# Patient Record
Sex: Male | Born: 1975 | Hispanic: Yes | Marital: Married | Smoking: Current every day smoker
Health system: Southern US, Community
[De-identification: ages and names within clinical notes are randomized; demographics above are authoritative.]

---

## 2011-11-14 ENCOUNTER — Emergency Department (HOSPITAL_COMMUNITY)
Admission: EM | Admit: 2011-11-14 | Discharge: 2011-11-14 | Disposition: A | Discharge status: Home / Self Care | Payer: BC Managed Care – PPO | Attending: Emergency Medicine | Admitting: Emergency Medicine

## 2011-11-14 ENCOUNTER — Emergency Department (HOSPITAL_COMMUNITY): Payer: BC Managed Care – PPO

## 2011-11-14 ENCOUNTER — Encounter (HOSPITAL_COMMUNITY): Payer: Self-pay | Admitting: Emergency Medicine

## 2011-11-14 DIAGNOSIS — N200 Calculus of kidney: Secondary | ICD-10-CM | POA: Insufficient documentation

## 2011-11-14 DIAGNOSIS — F17200 Nicotine dependence, unspecified, uncomplicated: Secondary | ICD-10-CM | POA: Insufficient documentation

## 2011-11-14 DIAGNOSIS — N1330 Unspecified hydronephrosis: Secondary | ICD-10-CM | POA: Insufficient documentation

## 2011-11-14 LAB — URINALYSIS, ROUTINE W REFLEX MICROSCOPIC
Ketones, ur: 15 mg/dL — AB
Leukocytes, UA: NEGATIVE
Nitrite: NEGATIVE
Specific Gravity, Urine: 1.031 — ABNORMAL HIGH (ref 1.005–1.030)
pH: 6 (ref 5.0–8.0)

## 2011-11-14 LAB — BASIC METABOLIC PANEL
BUN: 16 mg/dL (ref 6–23)
Chloride: 102 mEq/L (ref 96–112)
GFR calc Af Amer: >90 mL/min (ref >90)
Potassium: 3.3 mEq/L — ABNORMAL LOW (ref 3.5–5.1)

## 2011-11-14 LAB — CBC WITH DIFFERENTIAL/PLATELET
Basophils Absolute: 0 10*3/uL (ref 0.0–0.1)
Basophils Relative: 0 % (ref 0–1)
Hemoglobin: 15.1 g/dL (ref 13.0–17.0)
MCHC: 34.4 g/dL (ref 30.0–36.0)
Monocytes Relative: 5 % (ref 3–12)
Neutro Abs: 9 10*3/uL — ABNORMAL HIGH (ref 1.7–7.7)
Neutrophils Relative %: 81 % — ABNORMAL HIGH (ref 43–77)
RBC: 4.64 MIL/uL (ref 4.22–5.81)
WBC: 11.2 10*3/uL — ABNORMAL HIGH (ref 4.0–10.5)

## 2011-11-14 LAB — URINE MICROSCOPIC-ADD ON

## 2011-11-14 MED ORDER — HYDROMORPHONE HCL PF 1 MG/ML IJ SOLN
1.0000 mg | Freq: Once | INTRAMUSCULAR | Status: AC
  Administered 2011-11-14: 1 mg via INTRAVENOUS
  Filled 2011-11-14: qty 1

## 2011-11-14 MED ORDER — ONDANSETRON HCL 4 MG/2ML IJ SOLN
4.0000 mg | Freq: Once | INTRAMUSCULAR | Status: AC
  Administered 2011-11-14: 4 mg via INTRAVENOUS
  Filled 2011-11-14: qty 2

## 2011-11-14 MED ORDER — TAMSULOSIN HCL 0.4 MG PO CAPS: 0.4000 mg | ORAL_CAPSULE | Freq: Every day | ORAL | Status: AC

## 2011-11-14 MED ORDER — ONDANSETRON HCL 4 MG PO TABS: 4.0000 mg | ORAL_TABLET | Freq: Four times a day (QID) | ORAL | Status: AC

## 2011-11-14 MED ORDER — MORPHINE SULFATE 4 MG/ML IJ SOLN
6.0000 mg | Freq: Once | INTRAMUSCULAR | Status: AC
  Administered 2011-11-14: 6 mg via INTRAVENOUS
  Filled 2011-11-14 (×2): qty 1

## 2011-11-14 MED ORDER — OXYCODONE-ACETAMINOPHEN 5-325 MG PO TABS: 2.0000 | ORAL_TABLET | ORAL | Status: AC | PRN

## 2011-11-14 NOTE — ED Notes (Signed)
Patient with abdominal pain, nausea and vomiting since Friday.  Patient states his pain goes from abdomen into groin and radiates into testicles on the right.

## 2011-11-14 NOTE — ED Notes (Signed)
Pt states understanding of discharge instructions 

## 2011-11-14 NOTE — ED Provider Notes (Signed)
History     CSN: [id number]  Arrival date & time 11/14/11  0028   First MD Initiated Contact with Patient 11/14/11 0115      Chief Complaint  Patient presents with  . Abdominal Pain    (Consider location/radiation/quality/duration/timing/severity/associated sxs/prior treatment) HPI 36 year old male presents to emergency room from home with complaint of right-sided abdominal pain. Patient reports pain is in his right lower abdomen radiating into his groin and testicle. Patient also complaining of right flank pain. Pain started on Friday. He has had nausea and vomiting since that time. He denies any fever, no sick contacts, no similar symptoms. Patient reports he is unable to get comfortable due to the pain. Patient's girlfriend reports that he is constantly trying to find a comfortable position and appears that he is in labor.  History reviewed. No pertinent past medical history.  History reviewed. No pertinent past surgical history.  History reviewed. No pertinent family history.  History  Substance Use Topics  . Smoking status: Current Everyday Smoker  . Smokeless tobacco: Not on file  . Alcohol Use: No      Review of Systems  All other systems reviewed and are negative.   other than listed in history of present illness  Allergies  Review of patient's allergies indicates no known allergies.  Home Medications   Current Outpatient Rx  Name Route Sig Dispense Refill  . ACETAMINOPHEN 500 MG PO TABS Oral Take 1,000 mg by mouth every 6 (six) hours as needed. For pain      BP 123/79  Temp 98.7 F (37.1 C) (Oral)  Resp 24  SpO2 98%  Physical Exam  Nursing note and vitals reviewed. Constitutional: He appears well-developed and well-nourished. He appears distressed (in significant pain).  HENT:  Head: Normocephalic and atraumatic.  Nose: Nose normal.  Mouth/Throat: Oropharynx is clear and moist. No oropharyngeal exudate.  Neck: Normal range of motion. Neck supple.  No JVD present. No tracheal deviation present. No thyromegaly present.  Cardiovascular: Normal rate, regular rhythm, normal heart sounds and intact distal pulses.  Exam reveals no gallop and no friction rub.   No murmur heard. Pulmonary/Chest: Effort normal and breath sounds normal. No stridor. No respiratory distress. He has no wheezes. He has no rales. He exhibits no tenderness.  Abdominal: Soft. Bowel sounds are normal. He exhibits no distension and no mass. There is no tenderness. There is no rebound and no guarding.  Musculoskeletal: Normal range of motion. He exhibits no edema and no tenderness.  Lymphadenopathy:    He has no cervical adenopathy.  Skin: Skin is warm and dry. No rash noted. No erythema. No pallor.  Psychiatric: He has a normal mood and affect. His behavior is normal. Judgment and thought content normal.    ED Course  Procedures (including critical care time)  Labs Reviewed  URINALYSIS, ROUTINE W REFLEX MICROSCOPIC - Abnormal; Notable for the following:    Color, Urine AMBER (*)  BIOCHEMICALS MAY BE AFFECTED BY COLOR   APPearance TURBID (*)     Specific Gravity, Urine 1.031 (*)     Hgb urine dipstick LARGE (*)     Ketones, ur 15 (*)     Protein, ur 100 (*)     All other components within normal limits  CBC WITH DIFFERENTIAL - Abnormal; Notable for the following:    WBC 11.2 (*)     Neutrophils Relative 81 (*)     Neutro Abs 9.0 (*)     All other components within  normal limits  BASIC METABOLIC PANEL - Abnormal; Notable for the following:    Potassium 3.3 (*)     Glucose, Bld 132 (*)     All other components within normal limits  URINE MICROSCOPIC-ADD ON - Abnormal; Notable for the following:    Squamous Epithelial / LPF FEW (*)     Bacteria, UA FEW (*)     Crystals CA OXALATE CRYSTALS (*)     All other components within normal limits   Ct Abdomen Pelvis Wo Contrast  11/14/2011  *RADIOLOGY REPORT*  Clinical Data: Right-sided abdominal pain.  CT ABDOMEN AND  PELVIS WITHOUT CONTRAST  Technique:  Multidetector CT imaging of the abdomen and pelvis was performed following the standard protocol without intravenous contrast.  Comparison: None.  Findings: Limited images through the lung bases demonstrate no significant appreciable abnormality. The heart size is within normal limits. No pleural or pericardial effusion.  Organ abnormality/lesion detection is limited in the absence of intravenous contrast. Within this limitation, unremarkable liver, biliary system, spleen, pancreas, adrenal glands, left kidney. There is right renal edema and mild perinephric fat stranding. Nonobstructing interpolar stone.  Mild hydroureteronephrosis to the level of a 4 mm right UVJ stone.  No bowel obstruction.  No CT evidence for colitis.  Normal appendix.  No free intraperitoneal air or fluid.  Normal caliber vasculature.  No lymphadenopathy.  Circumferential bladder wall thickening is nonspecific given incomplete distension.  Nonspecific prostatic calcifications.  No acute osseous finding.  IMPRESSION: Mild right hydroureteronephrosis to the level of a 4 mm right UVJ stone.  Additional nonobstructing tiny interpolar stone on the right.  Original Report Authenticated By: Waneta Martins, M.D.     1. Kidney stone on right side       MDM  36 year old male who appears to have renal colic. Patient with blood noted in his urine. Will get CT abdomen pelvis without contrast to evaluate for size and location of stone        Olivia Mackie, MD 11/14/11 [phone]

## 2018-08-03 ENCOUNTER — Encounter (HOSPITAL_COMMUNITY): Payer: Self-pay

## 2018-08-03 ENCOUNTER — Emergency Department (HOSPITAL_COMMUNITY)
Admission: EM | Admit: 2018-08-03 | Discharge: 2018-08-03 | Disposition: A | Discharge status: Home / Self Care | Payer: Self-pay | Attending: Emergency Medicine | Admitting: Emergency Medicine

## 2018-08-03 ENCOUNTER — Emergency Department (HOSPITAL_COMMUNITY): Payer: Self-pay

## 2018-08-03 ENCOUNTER — Other Ambulatory Visit: Payer: Self-pay

## 2018-08-03 DIAGNOSIS — N200 Calculus of kidney: Secondary | ICD-10-CM | POA: Insufficient documentation

## 2018-08-03 DIAGNOSIS — F17200 Nicotine dependence, unspecified, uncomplicated: Secondary | ICD-10-CM | POA: Insufficient documentation

## 2018-08-03 DIAGNOSIS — N201 Calculus of ureter: Secondary | ICD-10-CM | POA: Insufficient documentation

## 2018-08-03 LAB — BASIC METABOLIC PANEL
Anion gap: 11 (ref 5–15)
BUN: 14 mg/dL (ref 6–20)
CO2: 24 mmol/L (ref 22–32)
Calcium: 9.5 mg/dL (ref 8.9–10.3)
Chloride: 102 mmol/L (ref 98–111)
Creatinine, Ser: 0.87 mg/dL (ref 0.61–1.24)
GFR calc Af Amer: >60 mL/min (ref >60)
GFR calc non Af Amer: >60 mL/min (ref >60)
Glucose, Bld: 113 mg/dL — ABNORMAL HIGH (ref 70–99)
Potassium: 3.7 mmol/L (ref 3.5–5.1)
Sodium: 137 mmol/L (ref 135–145)

## 2018-08-03 LAB — CBC WITH DIFFERENTIAL/PLATELET
Abs Immature Granulocytes: 0.05 10*3/uL (ref 0.00–0.07)
Basophils Absolute: 0.1 10*3/uL (ref 0.0–0.1)
Basophils Relative: 0 %
Eosinophils Absolute: 0 10*3/uL (ref 0.0–0.5)
Eosinophils Relative: 0 %
HCT: 50.6 % (ref 39.0–52.0)
Hemoglobin: 17.3 g/dL — ABNORMAL HIGH (ref 13.0–17.0)
Immature Granulocytes: 0 %
Lymphocytes Relative: 13 %
Lymphs Abs: 1.5 10*3/uL (ref 0.7–4.0)
MCH: 32.4 pg (ref 26.0–34.0)
MCHC: 34.2 g/dL (ref 30.0–36.0)
MCV: 94.8 fL (ref 80.0–100.0)
Monocytes Absolute: 0.7 10*3/uL (ref 0.1–1.0)
Monocytes Relative: 6 %
Neutro Abs: 9.5 10*3/uL — ABNORMAL HIGH (ref 1.7–7.7)
Neutrophils Relative %: 81 %
Platelets: 203 10*3/uL (ref 150–400)
RBC: 5.34 MIL/uL (ref 4.22–5.81)
RDW: 11.7 % (ref 11.5–15.5)
WBC: 11.8 10*3/uL — ABNORMAL HIGH (ref 4.0–10.5)
nRBC: 0 % (ref 0.0–0.2)

## 2018-08-03 LAB — URINALYSIS, ROUTINE W REFLEX MICROSCOPIC
Bacteria, UA: NONE SEEN
Bilirubin Urine: NEGATIVE
Glucose, UA: NEGATIVE mg/dL
Ketones, ur: NEGATIVE mg/dL
Leukocytes,Ua: NEGATIVE
Nitrite: NEGATIVE
Protein, ur: NEGATIVE mg/dL
RBC / HPF: >50 RBC/hpf — ABNORMAL HIGH (ref 0–5)
Specific Gravity, Urine: 1.017 (ref 1.005–1.030)
pH: 7 (ref 5.0–8.0)

## 2018-08-03 MED ORDER — OXYCODONE HCL 5 MG PO TABS: 5.0000 mg | ORAL_TABLET | ORAL | 0 refills | Status: DC | PRN

## 2018-08-03 MED ORDER — ONDANSETRON HCL 4 MG/2ML IJ SOLN
4.0000 mg | Freq: Once | INTRAMUSCULAR | Status: AC
  Administered 2018-08-03: 4 mg via INTRAVENOUS
  Filled 2018-08-03: qty 2

## 2018-08-03 MED ORDER — KETOROLAC TROMETHAMINE 15 MG/ML IJ SOLN
15.0000 mg | Freq: Once | INTRAMUSCULAR | Status: AC
  Administered 2018-08-03: 15 mg via INTRAVENOUS
  Filled 2018-08-03: qty 1

## 2018-08-03 MED ORDER — TAMSULOSIN HCL 0.4 MG PO CAPS: 0.4000 mg | ORAL_CAPSULE | Freq: Every day | ORAL | 0 refills | Status: AC

## 2018-08-03 MED ORDER — OXYCODONE HCL 5 MG PO TABS: 5.0000 mg | ORAL_TABLET | ORAL | 0 refills | Status: AC | PRN

## 2018-08-03 NOTE — ED Triage Notes (Signed)
Pt reports LLQ abdominal pain radiating to his flank and into his testicles. Pt appears to be uncomfortable. Hx of kidney stones.

## 2018-08-03 NOTE — ED Notes (Signed)
Patient verbalizes understanding of discharge instructions. Opportunity for questioning and answers were provided. 

## 2018-08-03 NOTE — ED Provider Notes (Signed)
[facility] Provider Note   CSN: [id number] Arrival date & time: 08/03/18  1512    History   Chief Complaint Chief Complaint  Patient presents with   Abdominal Pain    HPI Marcus Simon is a 43 y.o. male.     HPI 43 year old man with no significant past medical history presents to the emergency department for evaluation of acute onset left-sided flank pain today at work.  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  He states that it feels like a similar kidney stone that he had 3 years ago and was symptomatically treated with resolution.  Research shows that he had a 4 mm UVJ stone with mild hydronephrosis at that time that otherwise passed on its own.  Has not had any recurrence of stone since that time.  States that the pain radiates from the left flank down into bilateral testicles.  Denies any recent testicular trauma, dysuria, changes in bowel or bladder habits.  No difficulty walking and no weakness in the legs.  Denies any saddle anesthesia.  No recent trauma or injury to the back otherwise.  No history of chronic back disease.  Had one episode of emesis prior to arrival which he feels was related to his pain and not necessarily nausea.  Has otherwise had a normal appetite.  Does not take any regular medication. History reviewed. No pertinent past medical history.  There are no active problems to display for this patient.   History reviewed. No pertinent surgical history.      Home Medications    Prior to Admission medications   Medication Sig Start Date End Date Taking? Authorizing Provider  acetaminophen (TYLENOL) 500 MG tablet Take 1,000 mg by mouth every 6 (six) hours as needed. For pain    [provider]  Tamsulosin HCl (FLOMAX) 0.4 MG CAPS Take 1 capsule (0.4 mg total) by mouth daily. 11/14/11   Marisa Severintter, Olga, MD    Family History History reviewed. No pertinent family  history.  Social History Social History   Tobacco Use   Smoking status: Current Every Day Smoker   Smokeless tobacco: Never Used  Substance Use Topics   Alcohol use: No   Drug use: No     Allergies   Patient has no known allergies.   Review of Systems Review of Systems  Constitutional: Negative for chills and fever.  HENT: Negative for ear pain and sore throat.   Eyes: Negative for pain and visual disturbance.  Respiratory: Negative for cough and shortness of breath.   Cardiovascular: Negative for chest pain and palpitations.  Gastrointestinal: Negative for abdominal pain and vomiting.  Genitourinary: Positive for flank pain and testicular pain. Negative for dysuria, hematuria, penile pain and penile swelling.  Musculoskeletal: Negative for arthralgias and back pain.  Skin: Negative for color change and rash.  Neurological: Negative for seizures and syncope.  All other systems reviewed and are negative.    Physical Exam Updated Vital Signs BP (!) 119/92 (BP Location: Right Arm)    Pulse 71    Temp 97.8 F (36.6 C) (Oral)    Resp 18    SpO2 100%   Physical Exam Vitals signs and nursing note reviewed.  Constitutional:      General: He is in acute distress.     Appearance: He is well-developed.  HENT:     Head: Normocephalic and atraumatic.  Eyes:     Conjunctiva/sclera: Conjunctivae normal.  Neck:  Musculoskeletal: Neck supple.  Cardiovascular:     Rate and Rhythm: Normal rate and regular rhythm.     Heart sounds: No murmur.  Pulmonary:     Effort: Pulmonary effort is normal. No respiratory distress.     Breath sounds: Normal breath sounds.  Abdominal:     Palpations: Abdomen is soft.     Tenderness: There is no abdominal tenderness. There is left CVA tenderness. There is no right CVA tenderness, guarding or rebound.     Hernia: No hernia is present. There is no hernia in the umbilical area, ventral area, right inguinal area, left inguinal area, right  femoral area or left femoral area.  Genitourinary:    Scrotum/Testes: Normal. Cremasteric reflex is present.        Right: Tenderness or swelling not present.        Left: Tenderness or swelling not present.  Skin:    General: Skin is warm and dry.  Neurological:     Mental Status: He is alert.      ED Treatments / Results  Labs (all labs ordered are listed, but only abnormal results are displayed) Labs Reviewed - No data to display  EKG None  Radiology No results found.  Procedures Procedures (including critical care time)  Medications Ordered in ED Medications - No data to display   Initial Impression / Assessment and Plan / ED Course  I have reviewed the triage vital signs and the nursing notes.  Pertinent labs & imaging results that were available during my care of the patient were reviewed by me and considered in my medical decision making (see chart for details).         43 year old man presents to the emergency department with acute onset left-sided flank pain.  CT scan of the abdomen reveals left-sided 3 mm UVJ stone with mild hydronephrosis.  Basic lab work otherwise unremarkable.  Urinalysis does not show signs of infection.  Urine culture pending.  Patient had great relief of pain after 50 mg of Toradol.  Told to use Tylenol Motrin at home as well as intermittent lidocaine patches as needed for pain.  PMP database addressed.  Given 10 oxycodone tablets as well if needed for severe pain.  Patient in agreement with the plan for discharge.  Told her return to the emergency department if any worrisome signs or symptoms appear. Final Clinical Impressions(s) / ED Diagnoses   Final diagnoses:  Kidney stone    ED Discharge Orders    None       Ina Kick, MD 08/03/18 1802    Tegeler, Canary Brim, MD 08/04/18 1005

## 2018-08-03 NOTE — Discharge Instructions (Signed)
Please alternate Tylenol 500 mg and ibuprofen 400 mg every 6 hours for the next 7 days to help control your left-sided kidney stone pain.  You may also use lidocaine patches at home for continued relief of pain.  A prescription of oxycodone (a narcotic pain medication) has been sent to your pharmacy.  You may use this if needed for pain that is not able to be controlled with Tylenol, ibuprofen and lidocaine patches.

## 2018-08-04 LAB — URINE CULTURE: Culture: NO GROWTH

## 2020-11-06 ENCOUNTER — Emergency Department (HOSPITAL_COMMUNITY)
Admission: EM | Admit: 2020-11-06 | Discharge: 2020-11-06 | Disposition: A | Discharge status: Home / Self Care | Payer: 59 | Attending: Emergency Medicine | Admitting: Emergency Medicine

## 2020-11-06 ENCOUNTER — Encounter (HOSPITAL_COMMUNITY): Payer: Self-pay | Admitting: Pharmacy Technician

## 2020-11-06 ENCOUNTER — Emergency Department (HOSPITAL_COMMUNITY): Payer: 59

## 2020-11-06 ENCOUNTER — Other Ambulatory Visit: Payer: Self-pay

## 2020-11-06 ENCOUNTER — Emergency Department (HOSPITAL_BASED_OUTPATIENT_CLINIC_OR_DEPARTMENT_OTHER): Payer: 59

## 2020-11-06 DIAGNOSIS — M25552 Pain in left hip: Secondary | ICD-10-CM | POA: Insufficient documentation

## 2020-11-06 DIAGNOSIS — M79652 Pain in left thigh: Secondary | ICD-10-CM | POA: Insufficient documentation

## 2020-11-06 DIAGNOSIS — M79605 Pain in left leg: Secondary | ICD-10-CM

## 2020-11-06 DIAGNOSIS — M79604 Pain in right leg: Secondary | ICD-10-CM

## 2020-11-06 DIAGNOSIS — F17200 Nicotine dependence, unspecified, uncomplicated: Secondary | ICD-10-CM | POA: Diagnosis not present

## 2020-11-06 LAB — I-STAT CHEM 8, ED
BUN: 9 mg/dL (ref 6–20)
Calcium, Ion: 1.2 mmol/L (ref 1.15–1.40)
Chloride: 103 mmol/L (ref 98–111)
Creatinine, Ser: 0.4 mg/dL — ABNORMAL LOW (ref 0.61–1.24)
Glucose, Bld: 105 mg/dL — ABNORMAL HIGH (ref 70–99)
HCT: 42 % (ref 39.0–52.0)
Hemoglobin: 14.3 g/dL (ref 13.0–17.0)
Potassium: 3.6 mmol/L (ref 3.5–5.1)
Sodium: 140 mmol/L (ref 135–145)
TCO2: 26 mmol/L (ref 22–32)

## 2020-11-06 MED ORDER — NAPROXEN 500 MG PO TABS: 500.0000 mg | ORAL_TABLET | Freq: Two times a day (BID) | ORAL | 1 refills | Status: AC

## 2020-11-06 NOTE — ED Triage Notes (Signed)
Pt reports pain to posterior hip/leg for 1 year with worsening over the last few days. Pt denies known injury.

## 2020-11-06 NOTE — Progress Notes (Signed)
Bilateral lower extremity venous duplex completed. Refer to "CV Proc" under chart review to view preliminary results.  11/06/2020 6:20 PM Lovada Barwick S., MHA, RVT, RDCS, RDMS   

## 2020-11-06 NOTE — ED Provider Notes (Signed)
Emergency Medicine Provider Triage Evaluation Note  Marcus Simon , a 45 y.o. male  was evaluated in triage.  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  He denies paresthesia numbness in his lower extremities, denies shortness of breath, chest pain, has no history of PEs or DVTs, currently not on hormone therapy.  Denies recent falls, denies IV drug use..  Review of Systems  Positive: Right leg pain, right shoulder pain Negative: Shortness of breath, chest pain  Physical Exam  BP 140/82   Pulse 78   Temp 98.5 F (36.9 C) (Oral)   Resp 16   SpO2 100%  Gen:   Awake, no distress   Resp:  Normal effort  MSK:   Moves extremities without difficulty  Other:    Medical Decision Making  Medically screening exam initiated at 4:44 PM.  Appropriate orders placed.  Marcus Simon was informed that the remainder of the evaluation will be completed by another provider, this initial triage assessment does not replace that evaluation, and the importance of remaining in the ED until their evaluation is complete.  Presents with right-sided leg pain, imaging has been ordered, patient further work-up.   Marcus Simon 11/06/20 1646    Marcus Berkshire, MD 11/10/20 1019

## 2020-11-06 NOTE — Discharge Instructions (Addendum)
Follow-up with Dr.Dumonski or one of his partners in the next couple weeks

## 2020-11-06 NOTE — ED Notes (Signed)
Pt is going to 58 for vas. Study

## 2020-11-06 NOTE — ED Provider Notes (Signed)
[facility] EMERGENCY DEPARTMENT Provider Note   CSN: [id number] Arrival date & time: 11/06/20  1353     History Chief Complaint  Patient presents with   Leg Pain    Marcus Simon is a 45 y.o. male.  Patient complains of pain in his left hip for a number of months.  She also complains of some pain down her left thigh  The history is provided by the patient and medical records. No language interpreter was used.  Leg Pain Location:  Leg Time since incident:  3 weeks Injury: no   Leg location:  L leg Pain details:    Quality:  Aching   Radiates to:  Back   Severity:  Moderate   Onset quality:  Gradual   Timing:  Constant   Progression:  Worsening Chronicity:  Recurrent Associated symptoms: no back pain and no fatigue       History reviewed. No pertinent past medical history.  There are no problems to display for this patient.   History reviewed. No pertinent surgical history.     No family history on file.  Social History   Tobacco Use   Smoking status: Every Day   Smokeless tobacco: Never  Substance Use Topics   Alcohol use: No   Drug use: No    Home Medications Prior to Admission medications   Medication Sig Start Date End Date Taking? Authorizing Provider  atenolol (TENORMIN) 25 MG tablet Take 25 mg by mouth daily. 10/30/20  Yes [provider]  naproxen (NAPROSYN) 500 MG tablet Take 1 tablet (500 mg total) by mouth 2 (two) times daily. 11/06/20  Yes Bethann Berkshire, MD  Tamsulosin HCl (FLOMAX) 0.4 MG CAPS Take 1 capsule (0.4 mg total) by mouth daily. Patient not taking: No sig reported 11/14/11   Marisa Severin, MD    Allergies    Patient has no known allergies.  Review of Systems   Review of Systems  Constitutional:  Negative for appetite change and fatigue.  HENT:  Negative for congestion, ear discharge and sinus pressure.   Eyes:  Negative for discharge.  Respiratory:  Negative for cough.   Cardiovascular:   Negative for chest pain.  Gastrointestinal:  Negative for abdominal pain and diarrhea.  Genitourinary:  Negative for frequency and hematuria.  Musculoskeletal:  Negative for back pain.       Left leg pain  Skin:  Negative for rash.  Neurological:  Negative for seizures and headaches.  Psychiatric/Behavioral:  Negative for hallucinations.    Physical Exam Updated Vital Signs BP (!) 142/76   Pulse 76   Temp 98.5 F (36.9 C) (Oral)   Resp 16   SpO2 100%   Physical Exam Vitals and nursing note reviewed.  Constitutional:      Appearance: He is well-developed.  HENT:     Head: Normocephalic.     Nose: Nose normal.  Eyes:     General: No scleral icterus.    Conjunctiva/sclera: Conjunctivae normal.  Neck:     Thyroid: No thyromegaly.  Cardiovascular:     Rate and Rhythm: Normal rate and regular rhythm.     Heart sounds: No murmur heard.   No friction rub. No gallop.  Pulmonary:     Breath sounds: No stridor. No wheezing or rales.  Chest:     Chest wall: No tenderness.  Abdominal:     General: There is no distension.     Tenderness: There is no abdominal tenderness. There is no rebound.  Musculoskeletal:        General: Normal range of motion.     Cervical back: Neck supple.     Comments: Tender left lower leg and hip  Lymphadenopathy:     Cervical: No cervical adenopathy.  Skin:    Findings: No erythema or rash.  Neurological:     Mental Status: He is alert and oriented to person, place, and time.     Motor: No abnormal muscle tone.     Coordination: Coordination normal.  Psychiatric:        Behavior: Behavior normal.    ED Results / Procedures / Treatments   Labs (all labs ordered are listed, but only abnormal results are displayed) Labs Reviewed  I-STAT CHEM 8, ED - Abnormal; Notable for the following components:      Result Value   Creatinine, Ser 0.40 (*)    Glucose, Bld 105 (*)    All other components within normal limits    EKG None  Radiology DG  Lumbar Spine Complete  Result Date: 11/06/2020 CLINICAL DATA:  Left hip pain EXAM: LUMBAR SPINE - COMPLETE 4+ VIEW COMPARISON:  None. FINDINGS: There is no evidence of lumbar spine fracture. Alignment is normal. Intervertebral disc spaces are maintained. IMPRESSION: Negative. Electronically Signed   By: Helyn Numbers MD   On: 11/06/2020 22:10   DG Hip Unilat W or Wo Pelvis 2-3 Views Left  Result Date: 11/06/2020 CLINICAL DATA:  Left hip pain EXAM: DG HIP (WITH OR WITHOUT PELVIS) 2-3V LEFT COMPARISON:  None. FINDINGS: There is no evidence of hip fracture or dislocation. There is no evidence of arthropathy or other focal bone abnormality. IMPRESSION: Negative. Electronically Signed   By: Helyn Numbers MD   On: 11/06/2020 22:10   VAS [place] LOWER EXTREMITY VENOUS (DVT) (ONLY [facility] & [facility])  Result Date: 11/06/2020  Lower Venous DVT Study Patient Name:  Marcus Simon  Date of Exam:   11/06/2020 Medical Rec #: [id number]                Accession #:    [id number] Date of Birth: 1975-04-23                Patient Gender: M Patient Age:   45Y Exam Location:  [facility] Procedure:      VAS [place] LOWER EXTREMITY VENOUS (DVT) Referring Phys: [id number] Carroll Sage --------------------------------------------------------------------------------  Indications: Bilateral lower extremity pain x1 year, with recent worsening pain after exertion.  Comparison Study: No prior study Performing Technologist: Gertie Fey MHA, RDMS, RVT, RDCS  Examination Guidelines: A complete evaluation includes B-mode imaging, spectral Doppler, color Doppler, and power Doppler as needed of all accessible portions of each vessel. Bilateral testing is considered an integral part of a complete examination. Limited examinations for reoccurring indications may be performed as noted. The reflux portion of the exam is performed with the patient in reverse Trendelenburg.   +---------+---------------+---------+-----------+----------+--------------+ RIGHT    CompressibilityPhasicitySpontaneityPropertiesThrombus Aging +---------+---------------+---------+-----------+----------+--------------+ CFV      Full           No       Yes                                 +---------+---------------+---------+-----------+----------+--------------+ SFJ      Full                                                        +---------+---------------+---------+-----------+----------+--------------+  FV Prox  Full                                                        +---------+---------------+---------+-----------+----------+--------------+ FV Mid   Full                                                        +---------+---------------+---------+-----------+----------+--------------+ FV DistalFull                                                        +---------+---------------+---------+-----------+----------+--------------+ PFV      Full                                                        +---------+---------------+---------+-----------+----------+--------------+ POP      Full           No       Yes                                 +---------+---------------+---------+-----------+----------+--------------+ PTV      Full                                                        +---------+---------------+---------+-----------+----------+--------------+ PERO     Full                                                        +---------+---------------+---------+-----------+----------+--------------+   +---------+---------------+---------+-----------+----------+--------------+ LEFT     CompressibilityPhasicitySpontaneityPropertiesThrombus Aging +---------+---------------+---------+-----------+----------+--------------+ CFV      Full           No       Yes                                  +---------+---------------+---------+-----------+----------+--------------+ SFJ      Full                                                        +---------+---------------+---------+-----------+----------+--------------+ FV Prox  Full                                                        +---------+---------------+---------+-----------+----------+--------------+  FV Mid   Full                                                        +---------+---------------+---------+-----------+----------+--------------+ FV DistalFull                                                        +---------+---------------+---------+-----------+----------+--------------+ PFV      Full                                                        +---------+---------------+---------+-----------+----------+--------------+ POP      Full           No       Yes                                 +---------+---------------+---------+-----------+----------+--------------+ PTV      Full                                                        +---------+---------------+---------+-----------+----------+--------------+ PERO     Full                                                        +---------+---------------+---------+-----------+----------+--------------+     Summary: BILATERAL: - No evidence of deep vein thrombosis seen in the lower extremities, bilaterally. -No evidence of popliteal cyst, bilaterally.  Bilateral lower extremity venous flow is pulsatile, suggestive of possibly elevated right heart pressure.  *See table(s) above for measurements and observations.    Preliminary     Procedures Procedures   Medications Ordered in ED Medications - No data to display  ED Course  I have reviewed the triage vital signs and the nursing notes.  Pertinent labs & imaging results that were available during my care of the patient were reviewed by me and considered in my medical decision making (see  chart for details).    MDM Rules/Calculators/A&P                           Patient with left hip pain that radiates down his left leg.  He will be placed on Naprosyn and referred to orthopedics Final Clinical Impression(s) / ED Diagnoses Final diagnoses:  Left leg pain    Rx / DC Orders ED Discharge Orders          Ordered    naproxen (NAPROSYN) 500 MG tablet  2 times daily        11/06/20 2237             Marshay Slates,  Jomarie LongsJoseph, MD 11/10/20 1019

## 2021-11-04 IMAGING — CR DG HIP (WITH OR WITHOUT PELVIS) 2-3V*L*
3 series · 3 of 3 positions shown · non-contrast
Comparison: None.

CLINICAL DATA: Left hip pain

EXAM:
DG HIP (WITH OR WITHOUT PELVIS) 2-3V LEFT

[pelvis ap (1 of 2)]
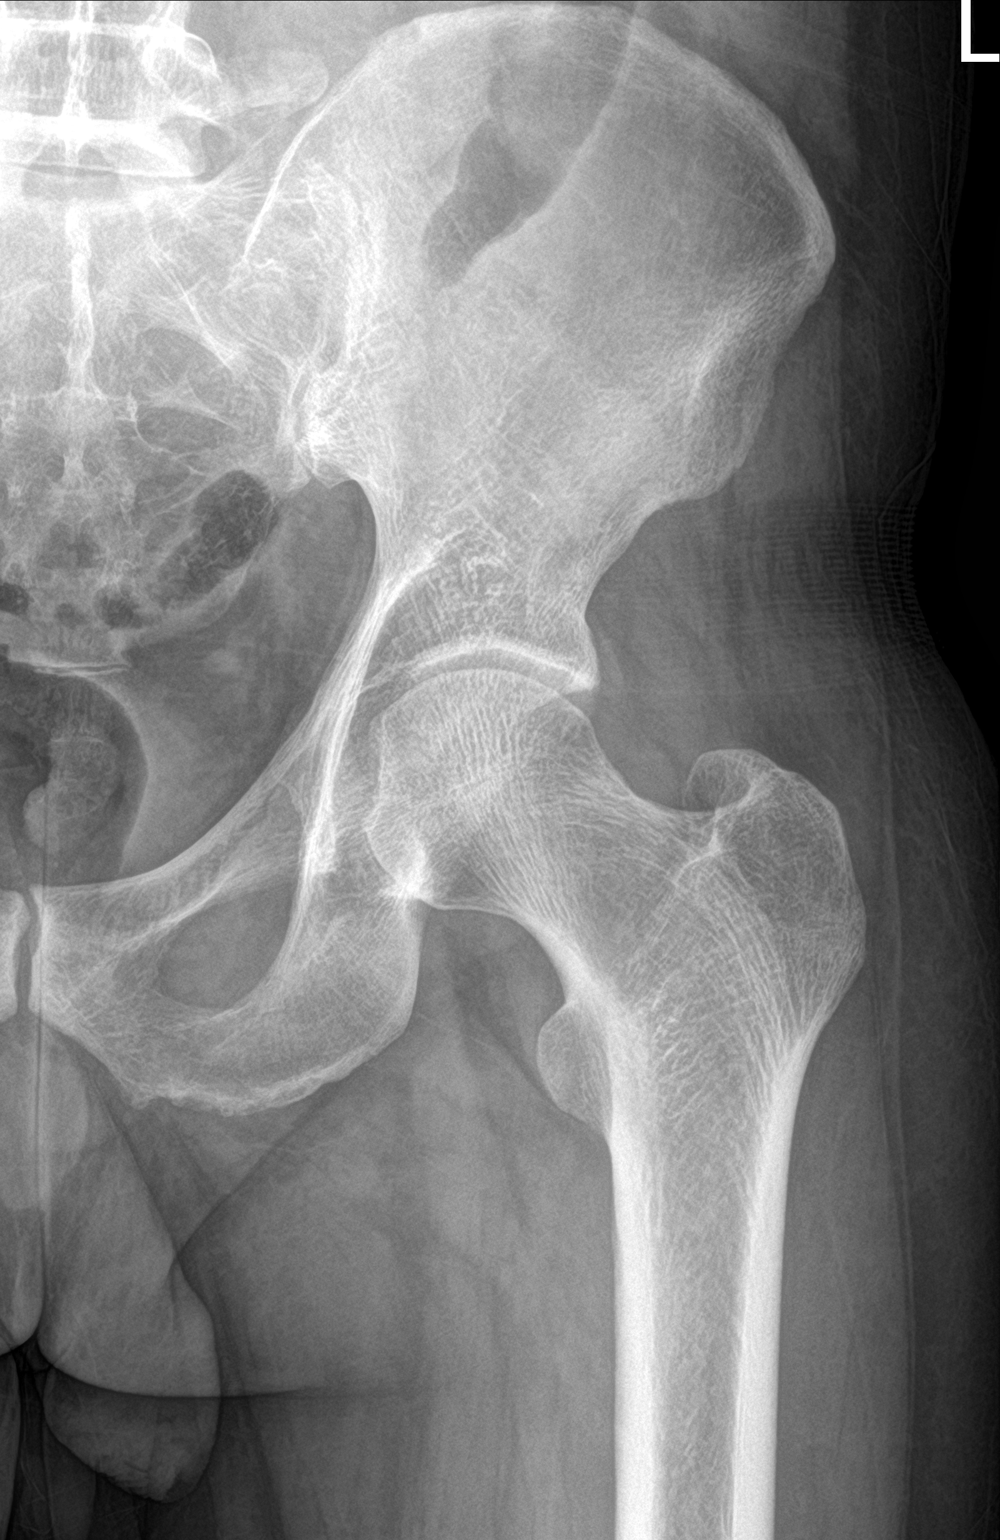

[hip lat]
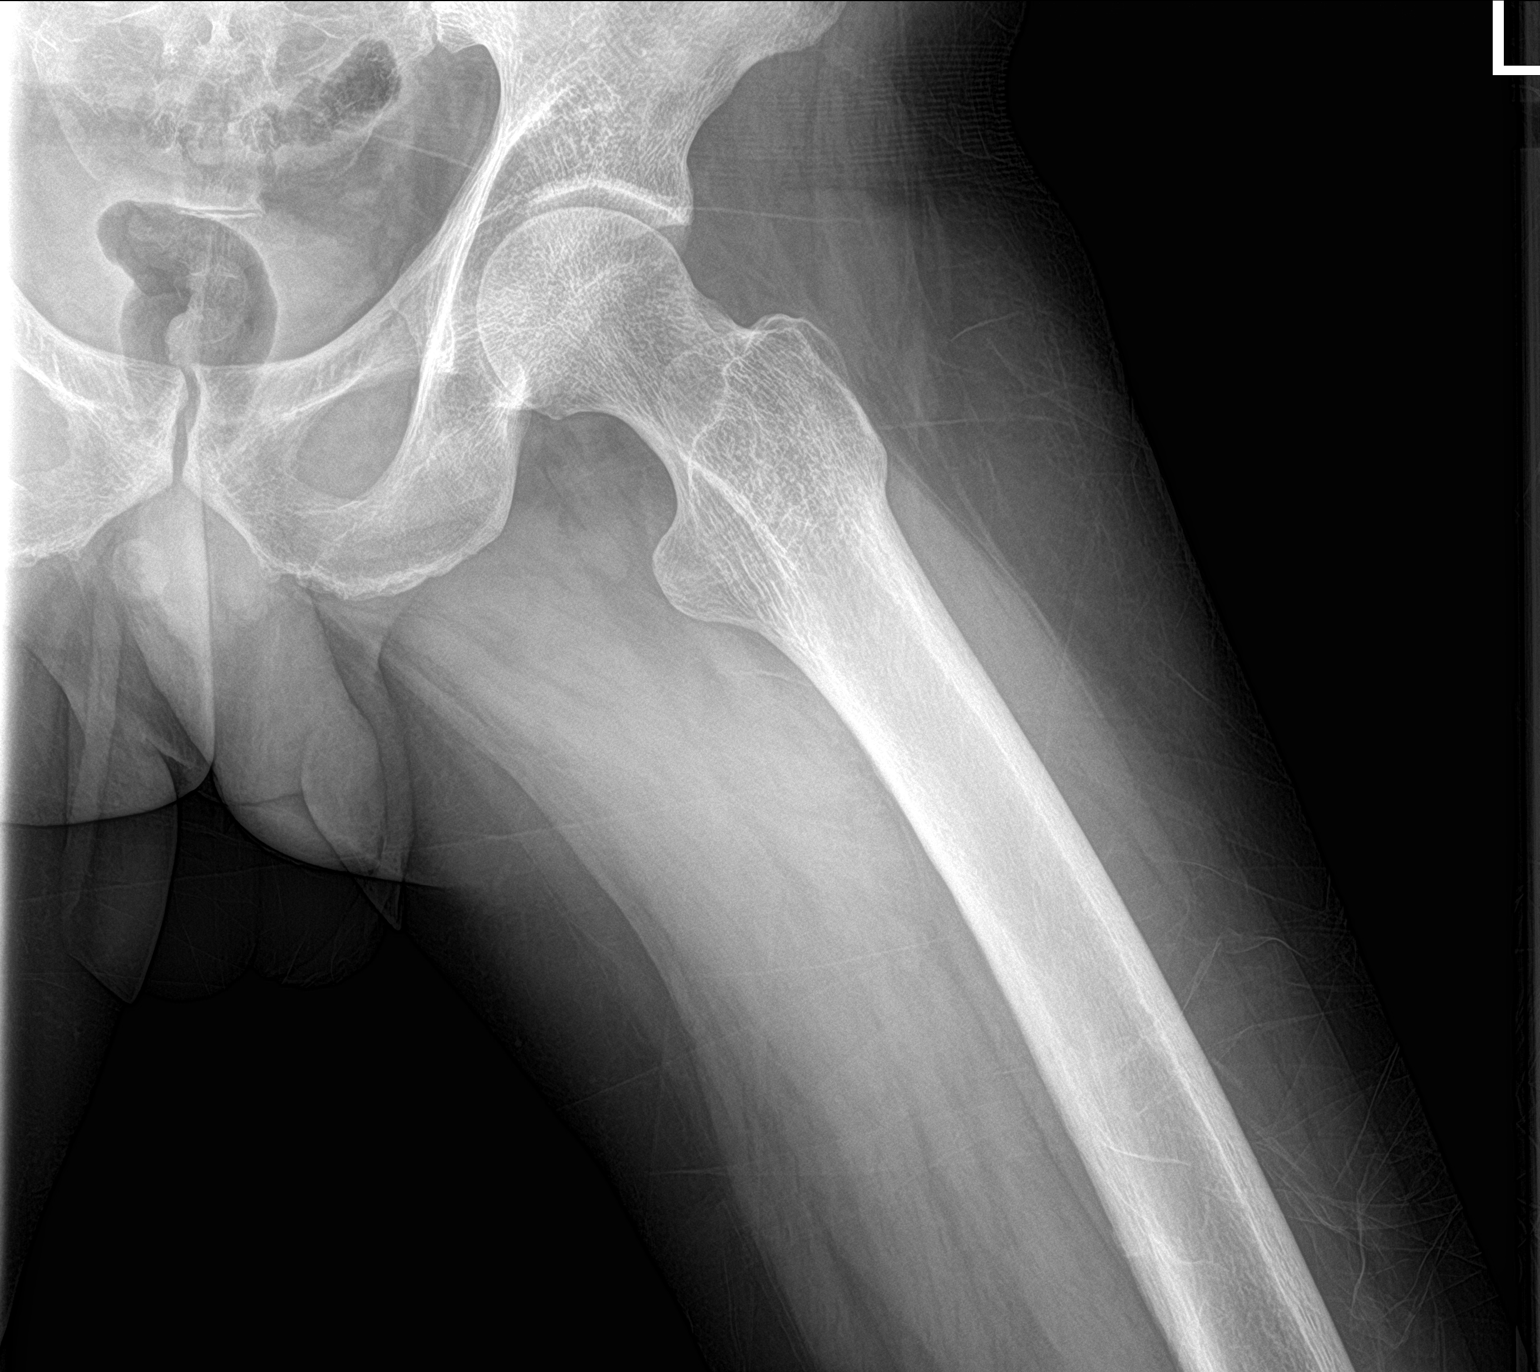

[pelvis ap (2 of 2)]
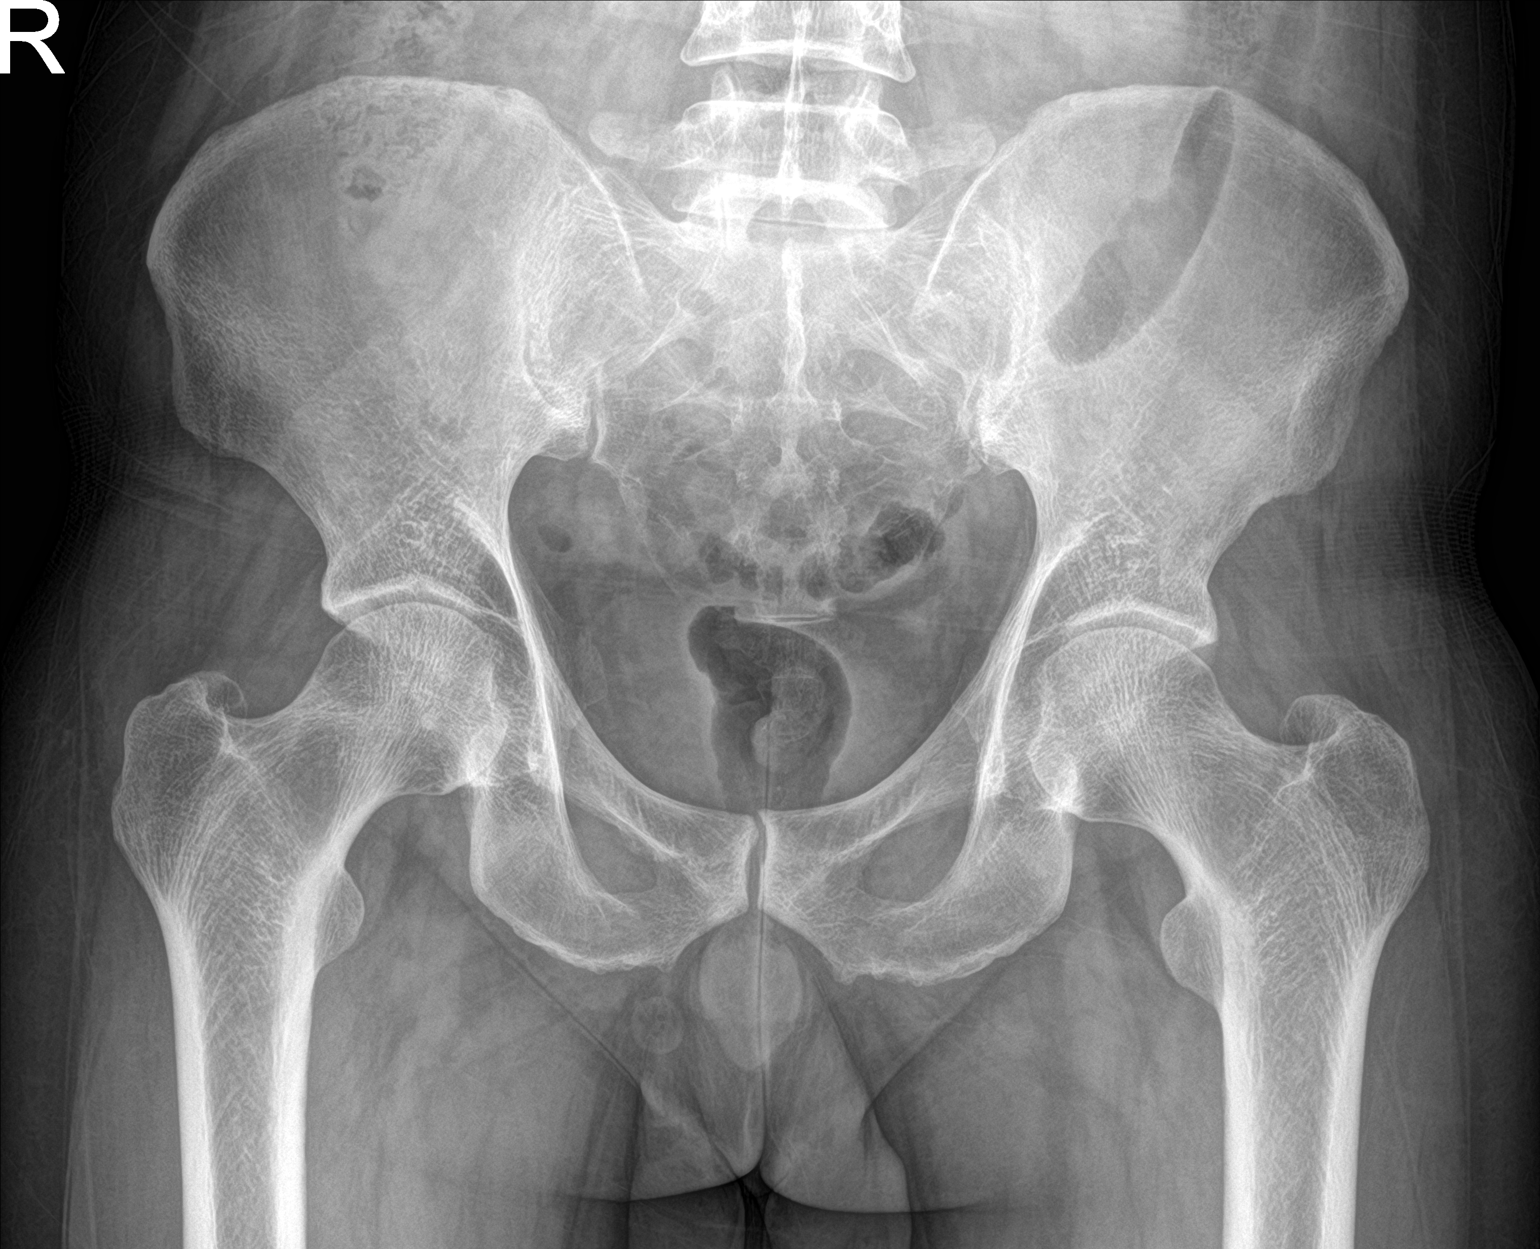

[3 of 3 positions shown; findings below may reference images not displayed]

FINDINGS: There is no evidence of hip fracture or dislocation. There is no
evidence of arthropathy or other focal bone abnormality.
IMPRESSION: Negative.
